# Patient Record
Sex: Male | Born: 1995 | Race: Black or African American | Hispanic: No | Marital: Single | Smoking: Current every day smoker
Health system: Southern US, Community
[De-identification: ages and names within clinical notes are randomized; demographics above are authoritative.]

---
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
## 2017-10-24 ENCOUNTER — Other Ambulatory Visit: Payer: Self-pay

## 2017-10-24 ENCOUNTER — Encounter: Payer: Self-pay | Admitting: Emergency Medicine

## 2017-10-24 ENCOUNTER — Emergency Department
Admission: EM | Admit: 2017-10-24 | Discharge: 2017-10-24 | Disposition: A | Discharge status: Home / Self Care | Payer: Self-pay | Attending: Emergency Medicine | Admitting: Emergency Medicine

## 2017-10-24 DIAGNOSIS — H9203 Otalgia, bilateral: Secondary | ICD-10-CM | POA: Insufficient documentation

## 2017-10-24 DIAGNOSIS — J029 Acute pharyngitis, unspecified: Secondary | ICD-10-CM | POA: Insufficient documentation

## 2017-10-24 MED ORDER — GUAIFENESIN-CODEINE 100-10 MG/5ML PO SOLN: 10.0000 mL | Freq: Three times a day (TID) | ORAL | 0 refills | Status: AC | PRN

## 2017-10-24 MED ORDER — AMOXICILLIN 500 MG PO TABS: 500.0000 mg | ORAL_TABLET | Freq: Three times a day (TID) | ORAL | 0 refills | Status: AC

## 2017-10-24 NOTE — ED Notes (Signed)
See triage note  States he developed ear pain about 1 week ago  Then started with sore throat 2 days ago  Unsure of fever  Afebrile on arrival

## 2017-10-24 NOTE — ED Provider Notes (Signed)
[facility] Emergency Department Provider Note  ____________________________________________  Time seen: Approximately 7:56 AM  I have reviewed the triage vital signs and the nursing notes.   HISTORY  Chief Complaint Sore Throat and Otalgia    HPI Micheal Pollard is a 22 y.o. male who presents to the emergency department for treatment and evaluation of ear pain that started a week ago and sore throat that started 2 days ago. No known fever, but having chills. No alleviating measures attempted for this complaint prior to arrival.   History reviewed. No pertinent past medical history.  There are no active problems to display for this patient.   History reviewed. No pertinent surgical history.  Prior to Admission medications   Medication Sig Start Date End Date Taking? Authorizing Provider  amoxicillin (AMOXIL) 500 MG tablet Take 1 tablet (500 mg total) by mouth 3 (three) times daily. 10/24/17   Arvell Pulsifer B, FNP  guaiFENesin-codeine 100-10 MG/5ML syrup Take 10 mLs by mouth 3 (three) times daily as needed. 10/24/17   Siera Beyersdorf, Rulon Eisenmenger B, FNP  ibuprofen (ADVIL,MOTRIN) 800 MG tablet Take 1 tablet (800 mg total) by mouth every 8 (eight) hours as needed. 09/25/15   Ignacia Bayley, PA-C    Allergies Patient has no known allergies.  No family history on file.  Social History Social History   Tobacco Use  . Smoking status: Never Smoker  . Smokeless tobacco: Never Used  Substance Use Topics  . Alcohol use: No  . Drug use: Not on file    Review of Systems Constitutional: Negative for fever. Eyes: No visual changes. ENT: Positive for sore throat; negative for difficulty swallowing. Respiratory: Denies shortness of breath. Gastrointestinal: No abdominal pain.  No nausea, no vomiting.  No diarrhea. Genitourinary: Negative for dysuria. Musculoskeletal: Positive for generalized body aches. Skin: negative for rash. Neurological: Positive for headaches,  negative for  focal weakness or numbness.  ____________________________________________   PHYSICAL EXAM:  VITAL SIGNS: ED Triage Vitals  Enc Vitals Group     BP 10/24/17 0715 120/67     Pulse --      Resp 10/24/17 0714 16     Temp 10/24/17 0714 98.1 F (36.7 C)     Temp Source 10/24/17 0714 Oral     SpO2 10/24/17 0714 100 %     Weight 10/24/17 0715 130 lb (59 kg)     Height 10/24/17 0715  (1.753 m)     Head Circumference --      Peak Flow --      Pain Score 10/24/17 0715 7     Pain Loc --      Pain Edu? --      Excl. in GC? --    Constitutional: Alert and oriented. Well appearing and in no acute distress. Eyes: Conjunctivae are normal.  Head: Atraumatic. Nose: No congestion/rhinnorhea. Mouth/Throat: Mucous membranes are moist.  Oropharynx erythematous, tonsils not visualized, no exudate. Uvula is mildline. Neck: No stridor.  Lymphatic: Lymphadenopathy: Bilateral anterior cervical lymphadenopathy. Cardiovascular: Normal rate, regular rhythm. Good peripheral circulation. Respiratory: Normal respiratory effort. Lungs CTAB. Gastrointestinal: Soft and nontender. Musculoskeletal: No lower extremity tenderness nor edema.  Neurologic:  Normal speech and language. No gross focal neurologic deficits are appreciated. Speech is normal. No gait instability. Skin:  Skin is warm, dry and intact. No rash noted Psychiatric: Mood and affect are normal. Speech and behavior are normal.  ____________________________________________   LABS (all labs ordered are listed, but only abnormal results are displayed)  Labs Reviewed - No data to display ____________________________________________  EKG  Not indicated. ____________________________________________  RADIOLOGY  Not indicated. ____________________________________________   PROCEDURES  Procedure(s) performed: None  Critical Care performed: No ____________________________________________   INITIAL IMPRESSION /  ASSESSMENT AND PLAN / ED COURSE  22 year old male presenting to the emergency department for treatment and evaluation of sore throat and bilateral ear pain.  He will be treated with amoxicillin due to the duration of symptoms.  He was advised to take Tylenol or ibuprofen for pain or fever.  He was advised to follow-up with primary care provider for choice for symptoms are not improving over the next 2 to 3 days.  Is advised to return to the emergency department for symptoms change or worsen if he is unable to schedule appointment.  Pertinent labs & imaging results that were available during my care of the patient were reviewed by me and considered in my medical decision making (see chart for details). ____________________________________________  New Prescriptions   AMOXICILLIN (AMOXIL) 500 MG TABLET    Take 1 tablet (500 mg total) by mouth 3 (three) times daily.   GUAIFENESIN-CODEINE 100-10 MG/5ML SYRUP    Take 10 mLs by mouth 3 (three) times daily as needed.    FINAL CLINICAL IMPRESSION(S) / ED DIAGNOSES  Final diagnoses:  Pharyngitis, unspecified etiology  Otalgia of both ears    If controlled substance prescribed during this visit, 12 month history viewed on the [facility] prior to issuing an initial prescription for Schedule II or III opiod.   Note:  This document was prepared using Dragon voice recognition software and may include unintentional dictation errors.    Chinita Pester, FNP 10/24/17 1610    Jene Every, MD 10/24/17 1052

## 2017-10-24 NOTE — ED Triage Notes (Signed)
Sore throat and right ear pain for a few days now.

## 2018-08-14 ENCOUNTER — Encounter: Payer: Self-pay | Admitting: Emergency Medicine

## 2018-08-14 ENCOUNTER — Emergency Department
Admission: EM | Admit: 2018-08-14 | Discharge: 2018-08-14 | Disposition: A | Discharge status: Home / Self Care | Payer: Medicaid Other | Attending: Emergency Medicine | Admitting: Emergency Medicine

## 2018-08-14 ENCOUNTER — Other Ambulatory Visit: Payer: Self-pay

## 2018-08-14 DIAGNOSIS — R05 Cough: Secondary | ICD-10-CM | POA: Insufficient documentation

## 2018-08-14 DIAGNOSIS — Z5321 Procedure and treatment not carried out due to patient leaving prior to being seen by health care provider: Secondary | ICD-10-CM | POA: Insufficient documentation

## 2018-08-14 NOTE — ED Notes (Signed)
Notified by EDP that pt is not in room. Unable to locate pt in bathroom.

## 2018-08-14 NOTE — ED Notes (Signed)
Again unable to locate pt in department. EDP notified.

## 2018-08-14 NOTE — ED Triage Notes (Signed)
Patient complaining of HA, cough, and body aches, states back pain worse in AM.  Sx started 4 days ago.  Friend recently had the flu. Wearing adult face mask.

## 2023-05-30 ENCOUNTER — Encounter: Payer: Self-pay | Admitting: Emergency Medicine

## 2023-05-30 ENCOUNTER — Emergency Department
Admission: EM | Admit: 2023-05-30 | Discharge: 2023-05-30 | Disposition: A | Discharge status: Home / Self Care | Payer: No Typology Code available for payment source | Attending: Emergency Medicine | Admitting: Emergency Medicine

## 2023-05-30 ENCOUNTER — Other Ambulatory Visit: Payer: Self-pay

## 2023-05-30 ENCOUNTER — Emergency Department: Payer: No Typology Code available for payment source

## 2023-05-30 DIAGNOSIS — M25551 Pain in right hip: Secondary | ICD-10-CM | POA: Diagnosis not present

## 2023-05-30 DIAGNOSIS — R002 Palpitations: Secondary | ICD-10-CM | POA: Diagnosis present

## 2023-05-30 LAB — BASIC METABOLIC PANEL
Anion gap: 9 (ref 5–15)
BUN: 9 mg/dL (ref 6–20)
CO2: 23 mmol/L (ref 22–32)
Calcium: 9.2 mg/dL (ref 8.9–10.3)
Chloride: 105 mmol/L (ref 98–111)
Creatinine, Ser: 0.99 mg/dL (ref 0.61–1.24)
GFR, Estimated: >60 mL/min (ref >60)
Glucose, Bld: 97 mg/dL (ref 70–99)
Potassium: 3.6 mmol/L (ref 3.5–5.1)
Sodium: 137 mmol/L (ref 135–145)

## 2023-05-30 LAB — CBC
HCT: 39.9 % (ref 39.0–52.0)
Hemoglobin: 13.8 g/dL (ref 13.0–17.0)
MCH: 31.6 pg (ref 26.0–34.0)
MCHC: 34.6 g/dL (ref 30.0–36.0)
MCV: 91.3 fL (ref 80.0–100.0)
Platelets: 286 10*3/uL (ref 150–400)
RBC: 4.37 MIL/uL (ref 4.22–5.81)
RDW: 12.7 % (ref 11.5–15.5)
WBC: 7.4 10*3/uL (ref 4.0–10.5)
nRBC: 0 % (ref 0.0–0.2)

## 2023-05-30 LAB — TROPONIN I (HIGH SENSITIVITY): Troponin I (High Sensitivity): 3 ng/L (ref <18)

## 2023-05-30 MED ORDER — LIDOCAINE 4 % EX PTCH: 1.0000 | MEDICATED_PATCH | CUTANEOUS | 0 refills | Status: DC

## 2023-05-30 MED ORDER — LIDOCAINE 4 % EX PTCH: 1.0000 | MEDICATED_PATCH | CUTANEOUS | 0 refills | Status: AC

## 2023-05-30 MED ORDER — CYCLOBENZAPRINE HCL 10 MG PO TABS: 10.0000 mg | ORAL_TABLET | Freq: Three times a day (TID) | ORAL | 0 refills | Status: AC | PRN

## 2023-05-30 NOTE — Discharge Instructions (Addendum)
You were seen in the ER today for evaluation of your heart racing and your hip pain.  Your testing was overall reassuring.  I have included information for follow-up with cardiology for your palpitations.  I have also included information for follow-up with orthopedics regarding your hip pain.  You can take 650 mg of Tylenol every 6 hours and 600 mg of ibuprofen every 6 hours to help with your pain.  You can buy these in the liquid form over-the-counter without a prescription.  You can also trial a topical lidocaine patch.  I sent a prescription, but this may be cheaper over-the-counter.  Return to the ER for new or worsening symptoms.

## 2023-05-30 NOTE — ED Triage Notes (Signed)
Pt via POV from home. Pt c/o intermittent episodes of palpitations and SOB. States always happens when he is working. Reports he does have CP when the episodes happen. Pt c/o bilateral hip pain for the past 2 years but it worse recently. Pt is A&OX4 and NAD, ambulatory to triage.

## 2023-05-30 NOTE — ED Provider Notes (Signed)
[facility] Provider Note    Event Date/Time   First MD Initiated Contact with Patient 05/30/23 1419     (approximate)   History   Palpitations   HPI  Micheal Pollard is a 27 year old male presenting to the emergency department for evaluation of palpitations and hip pain.  Patient reports that for many years he has had intermittent episodes of palpitations.  Reports that he has fluttering of his chest for 2 to 3 seconds after which she feels like he needs to take a deep breath.  He has noticed increased frequency over the past 2 days.  In addition, he reports that for the past 2 years he has had hip pain more notably over the right side of his hip and right lower back.  No recent trauma.  No fevers or chills.  Was previously told to follow-up with the orthopedic specialist but never did.  Is still ambulatory.  Occasionally reports shooting pain down the back of his leg.  No bowel or bladder symptoms.     Physical Exam   Triage Vital Signs: ED Triage Vitals  Encounter Vitals Group     BP 05/30/23 1248 137/88     Systolic BP Percentile --      Diastolic BP Percentile --      Pulse Rate 05/30/23 1248 73     Resp 05/30/23 1248 18     Temp 05/30/23 1248 98.3 F (36.8 C)     Temp Source 05/30/23 1248 Oral     SpO2 05/30/23 1248 96 %     Weight 05/30/23 1246 158 lb (71.7 kg)     Height 05/30/23 1246 5\' 7"  (1.702 m)     Head Circumference --      Peak Flow --      Pain Score 05/30/23 1246 8     Pain Loc --      Pain Education --      Exclude from Growth Chart --     Most recent vital signs: Vitals:   05/30/23 1248  BP: 137/88  Pulse: 73  Resp: 18  Temp: 98.3 F (36.8 C)  SpO2: 96%     General: Awake, interactive  CV:  Regular rate, good peripheral perfusion.  Chest wall: No significant tenderness to palpation Resp:  Unlabored respirations, lungs clear to auscultation Abd:  Nondistended, soft, nontender Neuro:  Symmetric facial movement,  fluid speech   ED Results / Procedures / Treatments   Labs (all labs ordered are listed, but only abnormal results are displayed) Labs Reviewed  BASIC METABOLIC PANEL  CBC  TROPONIN I (HIGH SENSITIVITY)     EKG EKG independently reviewed interpreted by myself (ER attending) demonstrates:  Initial EKG demonstrates what I suspect is sinus rhythm at a rate of 69, QRS 98, QTc 377, interpreted by computer as A-fib, but suspect that this is due to some baseline wander, P waves most readily appreciable in lead V5  Repeat EKG from 1453 demonstrates sinus rhythm at rate of 66 with sinus arrhythmia, PR 168, QRS 100, QTc 379, no acute ST changes  RADIOLOGY Imaging independently reviewed and interpreted by myself demonstrates:  CXR without focal consolidation on my review, formal radiology read pending  PROCEDURES:  Critical Care performed: No  Procedures   MEDICATIONS ORDERED IN ED: Medications - No data to display   IMPRESSION / MDM / ASSESSMENT AND PLAN / ED COURSE  I reviewed the triage vital signs and the nursing notes.  Differential  diagnosis includes, but is not limited to, arrhythmia, anemia, electrolyte abnormality, pneumonia, pneumothorax, low risk PE and PERC negative, regarding hip pain possible degenerative changes, sciatica, musculoskeletal strain, no clinical history suggestive of acute spinal cord pathology, acute fracture, septic hip  Patient's presentation is most consistent with acute presentation with potential threat to life or bodily function.  27 year old male presenting with palpitations and pain.  Vitals stable on presentation. Labs reassuring including negative troponin with greater than 3 hours of symptoms.  EKG demonstrates sinus rhythm without acute ischemic findings.  Physical exam reassuring.  Reviewed results of workup with patient.  Low suspicion for emergent pathology, but did discuss the importance of outpatient follow-up for further evaluation.  Did  discuss with his palpitations possibility of intermittent arrhythmia that we did not capture during her workup here.  He is comfortable with discharge home and outpatient follow-up.  Discussed supportive care for his hip pain.  Given information for outpatient follow-up with cardiology and orthopedics.  Strict return precautions provided.  Patient discharged stable condition.      FINAL CLINICAL IMPRESSION(S) / ED DIAGNOSES   Final diagnoses:  Palpitations  Pain of right hip     Rx / DC Orders   ED Discharge Orders          Ordered    lidocaine 4 %  Every 24 hours        05/30/23 1506    Ambulatory referral to Cardiology       Comments: If you have not heard from the Cardiology office within the next 72 hours please call [phone].   05/30/23 1508             Note:  This document was prepared using Dragon voice recognition software and may include unintentional dictation errors.   Trinna Post, MD 05/30/23 [phone]

## 2023-06-02 ENCOUNTER — Other Ambulatory Visit: Payer: Self-pay

## 2023-06-02 ENCOUNTER — Emergency Department: Payer: Medicaid Other

## 2023-06-02 ENCOUNTER — Emergency Department
Admission: EM | Admit: 2023-06-02 | Discharge: 2023-06-02 | Disposition: A | Discharge status: Home / Self Care | Payer: Medicaid Other | Attending: Emergency Medicine | Admitting: Emergency Medicine

## 2023-06-02 DIAGNOSIS — J209 Acute bronchitis, unspecified: Secondary | ICD-10-CM | POA: Diagnosis not present

## 2023-06-02 DIAGNOSIS — R0789 Other chest pain: Secondary | ICD-10-CM

## 2023-06-02 DIAGNOSIS — Z20822 Contact with and (suspected) exposure to covid-19: Secondary | ICD-10-CM | POA: Insufficient documentation

## 2023-06-02 LAB — RESP PANEL BY RT-PCR (RSV, FLU A&B, COVID)  RVPGX2
Influenza A by PCR: NEGATIVE
Influenza B by PCR: NEGATIVE
Resp Syncytial Virus by PCR: NEGATIVE
SARS Coronavirus 2 by RT PCR: NEGATIVE

## 2023-06-02 LAB — TROPONIN I (HIGH SENSITIVITY): Troponin I (High Sensitivity): 3 ng/L (ref <18)

## 2023-06-02 LAB — BASIC METABOLIC PANEL
Anion gap: 11 (ref 5–15)
BUN: 11 mg/dL (ref 6–20)
CO2: 24 mmol/L (ref 22–32)
Calcium: 9.4 mg/dL (ref 8.9–10.3)
Chloride: 101 mmol/L (ref 98–111)
Creatinine, Ser: 0.97 mg/dL (ref 0.61–1.24)
GFR, Estimated: >60 mL/min (ref >60)
Glucose, Bld: 102 mg/dL — ABNORMAL HIGH (ref 70–99)
Potassium: 3.4 mmol/L — ABNORMAL LOW (ref 3.5–5.1)
Sodium: 136 mmol/L (ref 135–145)

## 2023-06-02 LAB — CBC
HCT: 40.9 % (ref 39.0–52.0)
Hemoglobin: 14.1 g/dL (ref 13.0–17.0)
MCH: 31.7 pg (ref 26.0–34.0)
MCHC: 34.5 g/dL (ref 30.0–36.0)
MCV: 91.9 fL (ref 80.0–100.0)
Platelets: 292 10*3/uL (ref 150–400)
RBC: 4.45 MIL/uL (ref 4.22–5.81)
RDW: 12.8 % (ref 11.5–15.5)
WBC: 8.9 10*3/uL (ref 4.0–10.5)
nRBC: 0 % (ref 0.0–0.2)

## 2023-06-02 MED ORDER — PREDNISONE 50 MG PO TABS: 50.0000 mg | ORAL_TABLET | Freq: Every day | ORAL | 0 refills | Status: AC

## 2023-06-02 MED ORDER — ALBUTEROL SULFATE (2.5 MG/3ML) 0.083% IN NEBU
2.5000 mg | INHALATION_SOLUTION | Freq: Once | RESPIRATORY_TRACT | Status: AC
  Administered 2023-06-02: 2.5 mg via RESPIRATORY_TRACT
  Filled 2023-06-02: qty 3

## 2023-06-02 MED ORDER — ALBUTEROL SULFATE HFA 108 (90 BASE) MCG/ACT IN AERS: 2.0000 | INHALATION_SPRAY | RESPIRATORY_TRACT | 0 refills | Status: AC | PRN

## 2023-06-02 MED ORDER — IBUPROFEN 600 MG PO TABS
600.0000 mg | ORAL_TABLET | Freq: Once | ORAL | Status: AC
Start: 2023-06-02 — End: 2023-06-02
  Administered 2023-06-02: 600 mg via ORAL
  Filled 2023-06-02: qty 1

## 2023-06-02 MED ORDER — IBUPROFEN 600 MG PO TABS
600.0000 mg | ORAL_TABLET | Freq: Four times a day (QID) | ORAL | 0 refills | Status: AC | PRN
Start: 2023-06-02 — End: ?

## 2023-06-02 NOTE — ED Triage Notes (Signed)
Pt arrives with c/o chest tightness that started yesterday. Pt reports the tightness radiates to left part of his chest. Pt endorses SOB and nausea. Pt seen a few days ago for palpitations.

## 2023-06-02 NOTE — Discharge Instructions (Signed)
Take the ibuprofen every 6 hours as needed for pain.  Take the prednisone daily for the next 5 days.  Use the albuterol inhaler as needed up to every 4 hours for chest tightness or shortness of breath.  Follow-up with your primary care provider.  Return to the ER for new, worsening, or persistent severe chest pain, difficulty breathing, weakness or lightheadedness, high fever, or any other new or worsening symptoms that concern you.

## 2023-06-02 NOTE — ED Provider Notes (Signed)
[facility] Provider Note    Event Date/Time   First MD Initiated Contact with Patient 06/02/23 1634     (approximate)   History   Chest Pain   HPI  Micheal Pollard is a 27 y.o. male with no send past medical history who presents with chest discomfort described as tightness for the last 2 days, associated with shortness of breath and a sensation of phlegm or something coming up into his throat.  He denies any nasal congestion or rhinorrhea.  He has an intermittent cough.  He denies any fever, vomiting, or diarrhea, and denies leg swelling.  He denies sick contacts.  He was seen in the ED a few days ago for palpitations although was not having the chest discomfort at that time.  Reviewed the past medical records.  The patient was seen in the ED on 12/19 with palpitations and hip pain.  Chest x-ray, basic labs, and troponin were negative at that time.   Physical Exam   Triage Vital Signs: ED Triage Vitals  Encounter Vitals Group     BP 06/02/23 1616 (!) 146/95     Systolic BP Percentile --      Diastolic BP Percentile --      Pulse Rate 06/02/23 1616 77     Resp 06/02/23 1616 15     Temp 06/02/23 1616 98.6 F (37 C)     Temp Source 06/02/23 1616 Oral     SpO2 06/02/23 1616 100 %     Weight 06/02/23 1615 158 lb (71.7 kg)     Height --      Head Circumference --      Peak Flow --      Pain Score 06/02/23 1615 9     Pain Loc --      Pain Education --      Exclude from Growth Chart --     Most recent vital signs: Vitals:   06/02/23 1616  BP: (!) 146/95  Pulse: 77  Resp: 15  Temp: 98.6 F (37 C)  SpO2: 100%     General: Awake, well-appearing, no distress.  CV:  Good peripheral perfusion.  Resp:  Normal effort.  Somewhat prolonged expiratory phase.  No wheezes or rales. Abd:  No distention.  Other:  No peripheral edema.   ED Results / Procedures / Treatments   Labs (all labs ordered are listed, but only abnormal results are  displayed) Labs Reviewed  BASIC METABOLIC PANEL - Abnormal; Notable for the following components:      Result Value   Potassium 3.4 (*)    Glucose, Bld 102 (*)    All other components within normal limits  RESP PANEL BY RT-PCR (RSV, FLU A&B, COVID)  RVPGX2  CBC  TROPONIN I (HIGH SENSITIVITY)     EKG  ED ECG REPORT I, Dionne Bucy, the attending physician, personally viewed and interpreted this ECG.  Date: 06/02/2023 EKG Time: 1617 Rate: 72 Rhythm: normal sinus rhythm QRS Axis: normal Intervals: normal ST/T Wave abnormalities: normal Narrative Interpretation: no evidence of acute ischemia    RADIOLOGY  Chest x-ray: I independently viewed and interpreted the images; there is no focal consolidation or edema    PROCEDURES:  Critical Care performed: No  Procedures   MEDICATIONS ORDERED IN ED: Medications  ibuprofen (ADVIL) tablet 600 mg (600 mg Oral Given 06/02/23 1700)  albuterol (PROVENTIL) (2.5 MG/3ML) 0.083% nebulizer solution 2.5 mg (2.5 mg Nebulization Given 06/02/23 1700)  IMPRESSION / MDM / ASSESSMENT AND PLAN / ED COURSE  I reviewed the triage vital signs and the nursing notes.  27 year old male with no significant past medical history presents with chest tightness for the last few days associated with some shortness of breath as well as intermittent cough.  On exam the patient is well-appearing with normal vital signs and no significant exam findings.  Differential diagnosis includes, but is not limited to, acute bronchitis, RSV, COVID-19, other viral syndrome, pneumonia, musculoskeletal chest wall pain, other benign etiology.  There is no clinical evidence for ACS, aortic dissection, other vascular etiology, or PE.    EKG is nonischemic.  Chest x-ray shows no acute findings.  We will obtain basic labs, cardiac enzymes, respiratory panel, give a trial of albuterol and ibuprofen, and reassess.  Patient's presentation is most consistent with acute  complicated illness / injury requiring diagnostic workup.  ----------------------------------------- 6:23 PM on 06/02/2023 -----------------------------------------  BMP and CBC show no acute findings.  Troponin is negative.  Given the duration of the symptoms there is no indication for repeat.  Respiratory panel is negative.    On reassessment, the patient states that the shortness of breath has resolved.  The pain is still present but improved.  Overall presentation is still consistent with bronchitis and/or musculoskeletal chest wall pain.  The patient is stable for discharge home.  I counseled him on the results of the workup and plan of care.  I gave strict return precautions and he expressed understanding.   FINAL CLINICAL IMPRESSION(S) / ED DIAGNOSES   Final diagnoses:  Atypical chest pain  Acute bronchitis, unspecified organism     Rx / DC Orders   ED Discharge Orders          Ordered    albuterol (VENTOLIN HFA) 108 (90 Base) MCG/ACT inhaler  Every 4 hours PRN        06/02/23 1822    ibuprofen (ADVIL) 600 MG tablet  Every 6 hours PRN        06/02/23 1822    predniSONE (DELTASONE) 50 MG tablet  Daily        06/02/23 1822             Note:  This document was prepared using Dragon voice recognition software and may include unintentional dictation errors.   Dionne Bucy, MD 06/02/23 [phone]
